# Patient Record
Sex: Female | Born: 1940 | Race: White | Hispanic: No | Marital: Single | Smoking: Never smoker
Health system: Southern US, Community
[De-identification: ages and names within clinical notes are randomized; demographics above are authoritative.]

---

## 2003-12-25 ENCOUNTER — Emergency Department (HOSPITAL_COMMUNITY): Admission: EM | Admit: 2003-12-25 | Discharge: 2003-12-25 | Payer: Self-pay | Admitting: Emergency Medicine

## 2005-12-12 IMAGING — CR DG ABDOMEN ACUTE W/ 1V CHEST
3 series · 3 of 3 positions shown · non-contrast
Comparison: none

CLINICAL DATA: Lower abdominal pain.
 ACUTE ABDOMEN SERIES, 12/25/03
 No prior studies.
 There is linear opacity in the right mid to lower lung consistent with subsegmental atelectasis or scarring.  The lungs appear otherwise clear.  Heart and mediastinum appear unremarkable.
 There are no signs of free intraperitoneal gas.  Calcific density in the left pelvis likely represents a uterine fibroid.  There is gas in the colon with scattered air-fluid levels raising the possibility of a diarrheal process.  No dilated bowel.
 IMPRESSION 
 No dilated bowel to suggest obstruction.
 Air-fluid levels scattered in the colon raising the possibility of diarrheal process.
 Probable small uterine leiomyoma.
 Scarring versus subsegmental atelectasis in the right mid lung.

[view not recorded (1 of 3)]
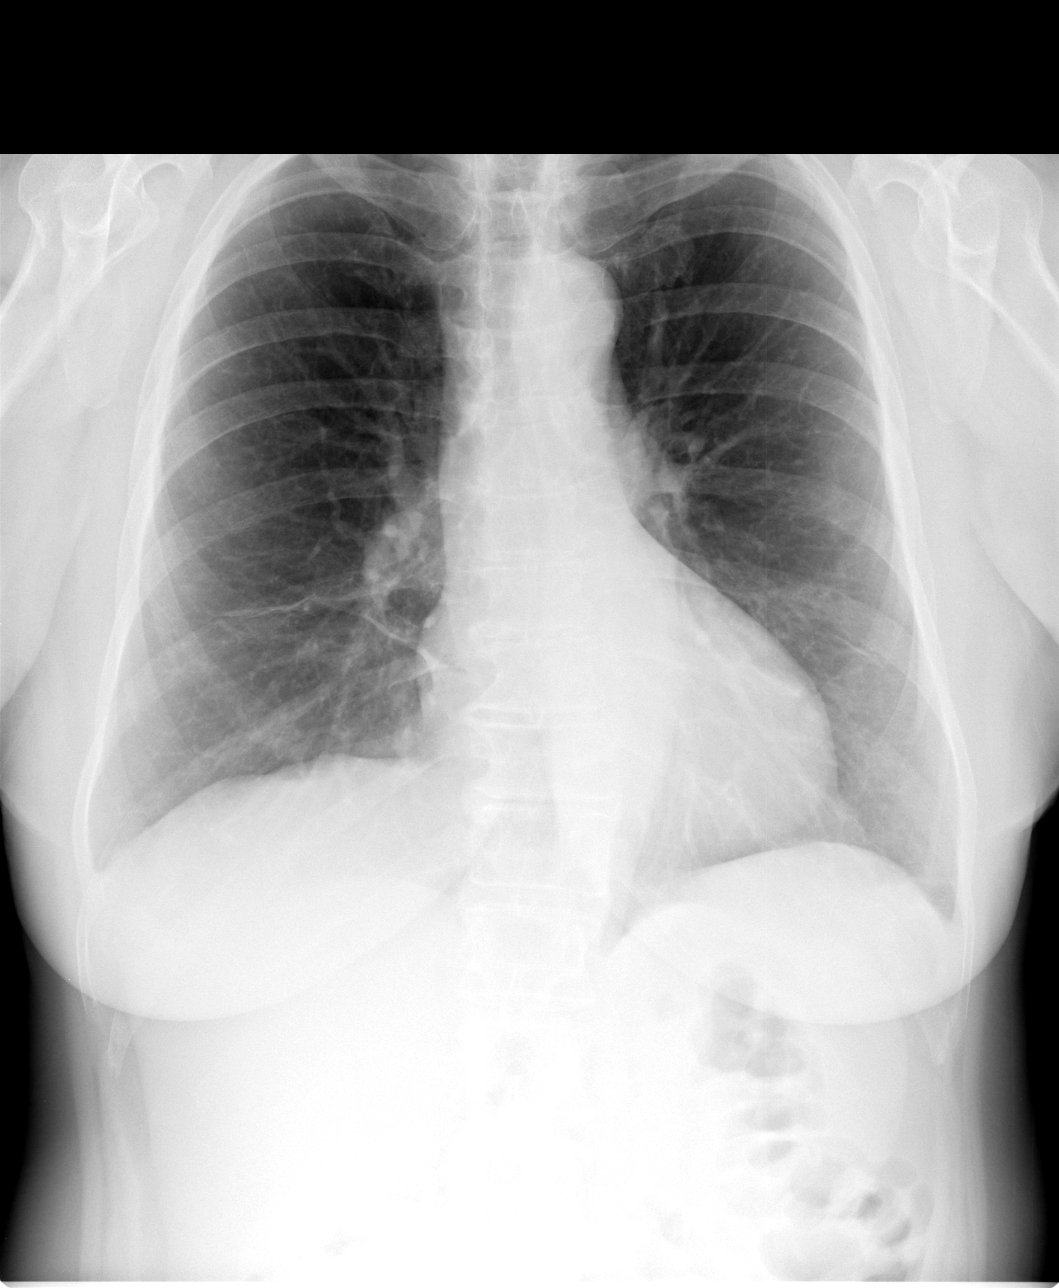

[view not recorded (2 of 3)]
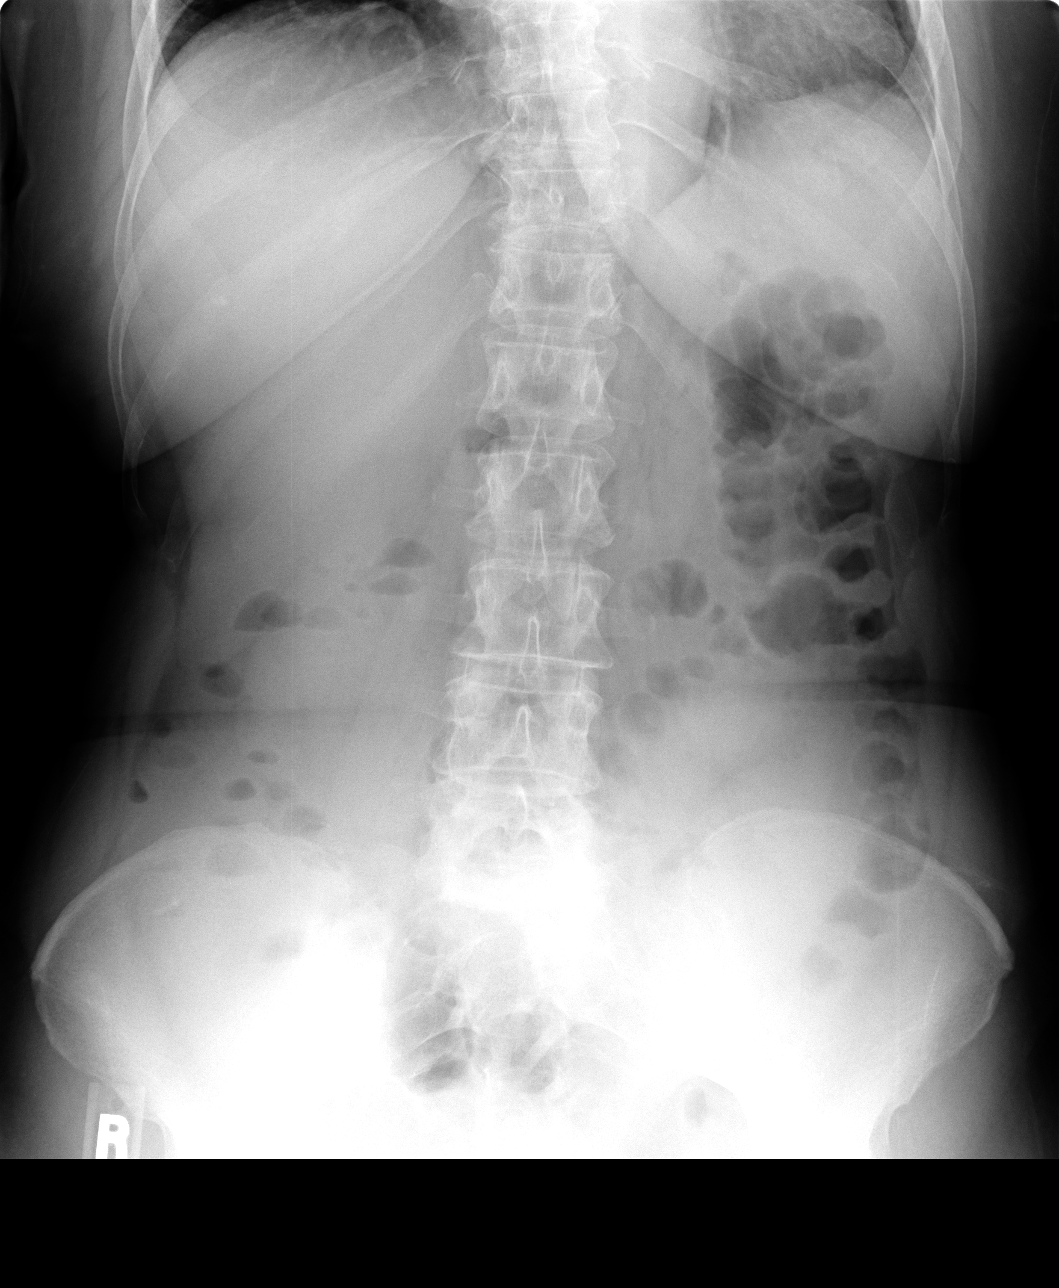

[view not recorded (3 of 3)]
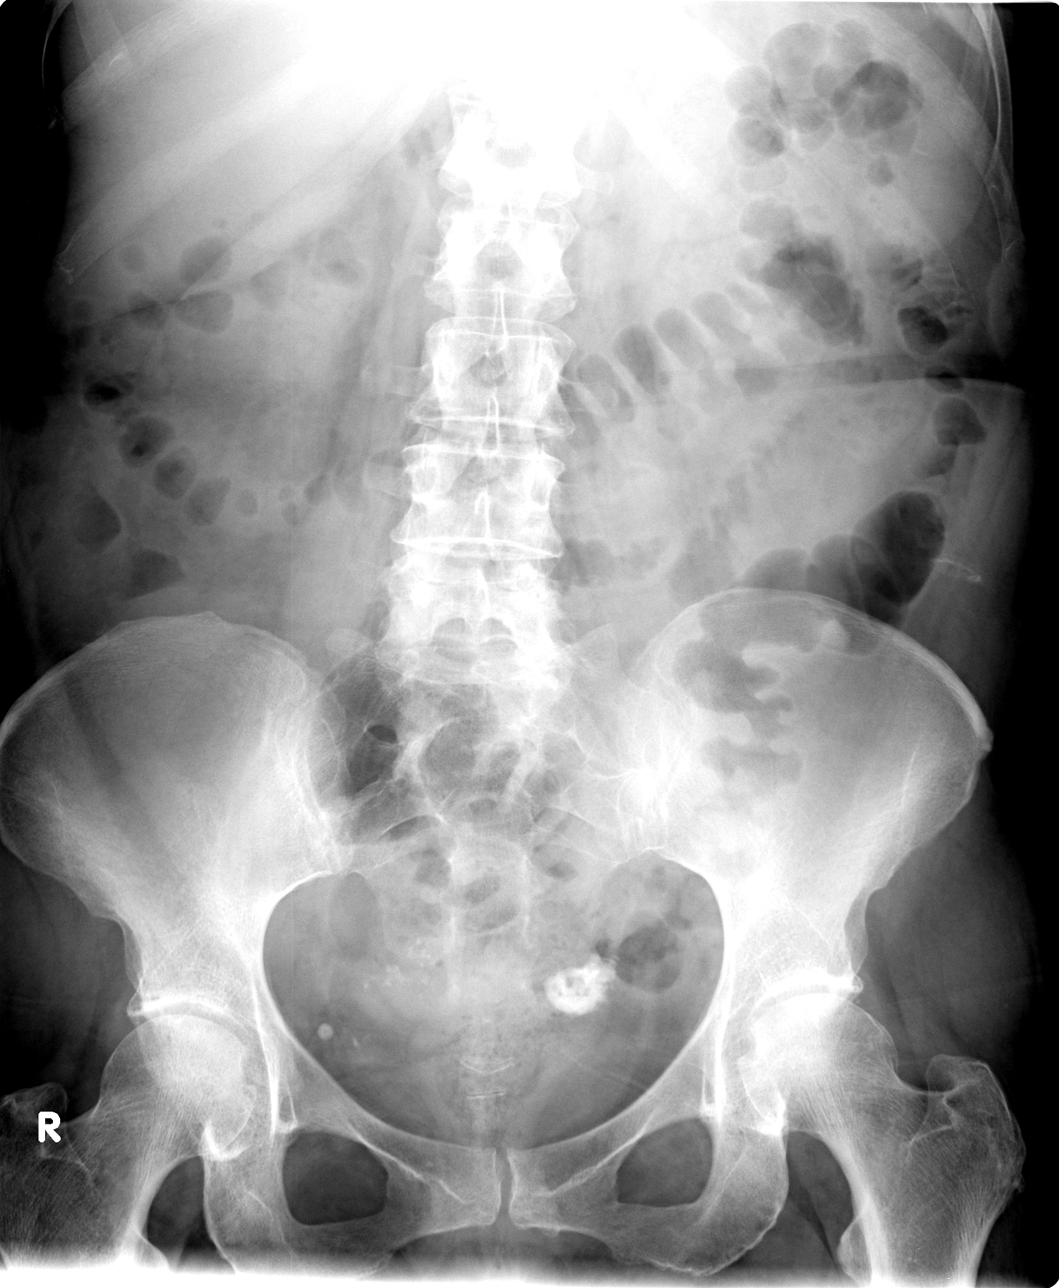

[3 of 3 positions shown; findings below may reference images not displayed]

## 2006-10-26 ENCOUNTER — Ambulatory Visit: Payer: Self-pay | Admitting: Family Medicine
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
## 2013-11-06 ENCOUNTER — Encounter (HOSPITAL_BASED_OUTPATIENT_CLINIC_OR_DEPARTMENT_OTHER): Payer: Self-pay | Admitting: Emergency Medicine

## 2013-11-06 ENCOUNTER — Emergency Department (HOSPITAL_BASED_OUTPATIENT_CLINIC_OR_DEPARTMENT_OTHER)
Admission: EM | Admit: 2013-11-06 | Discharge: 2013-11-06 | Discharge status: Against Medical Advice | Payer: Medicare Other | Attending: Emergency Medicine | Admitting: Emergency Medicine

## 2013-11-06 DIAGNOSIS — H5789 Other specified disorders of eye and adnexa: Secondary | ICD-10-CM | POA: Insufficient documentation

## 2013-11-06 DIAGNOSIS — L539 Erythematous condition, unspecified: Secondary | ICD-10-CM | POA: Insufficient documentation

## 2013-11-06 DIAGNOSIS — T4995XA Adverse effect of unspecified topical agent, initial encounter: Secondary | ICD-10-CM | POA: Insufficient documentation

## 2013-11-06 DIAGNOSIS — J302 Other seasonal allergic rhinitis: Secondary | ICD-10-CM

## 2013-11-06 DIAGNOSIS — Z79899 Other long term (current) drug therapy: Secondary | ICD-10-CM | POA: Insufficient documentation

## 2013-11-06 DIAGNOSIS — I10 Essential (primary) hypertension: Secondary | ICD-10-CM

## 2013-11-06 DIAGNOSIS — L299 Pruritus, unspecified: Secondary | ICD-10-CM | POA: Insufficient documentation

## 2013-11-06 MED ORDER — HYDROCHLOROTHIAZIDE 25 MG PO TABS: 25.0000 mg | ORAL_TABLET | Freq: Every day | ORAL | Status: AC

## 2013-11-06 MED ORDER — HYDRALAZINE HCL 20 MG/ML IJ SOLN: 20.0000 mg | Freq: Once | INTRAMUSCULAR | Status: DC

## 2013-11-06 MED ORDER — DIPHENHYDRAMINE HCL 25 MG PO CAPS: 25.0000 mg | ORAL_CAPSULE | Freq: Once | ORAL | Status: DC

## 2013-11-06 MED ORDER — LORATADINE 10 MG PO TABS: 10.0000 mg | ORAL_TABLET | Freq: Once | ORAL | Status: DC

## 2013-11-06 NOTE — ED Notes (Signed)
MD at bedside. 

## 2013-11-06 NOTE — ED Notes (Signed)
MD spent an extensive time speaking with patient about what test she would like to do.  Explained the reasonings such as organ damage due to patients BP being 237/129 for an extended period of time.  Pt refused all medical test.  Pt was not open to suggestions.  Pt continued to say "I dont need the medical world" "my stress is because I live alone".  Pt refused all testing and treatment reporting she couldn't afford it.  MD and I informed patient of social workers and case management who she could speak to about that and patient still refused.  MD having patient sign AMA.

## 2013-11-06 NOTE — ED Notes (Signed)
Attempted to go start IV and draw blood and do EKG.  PT reports she can't afford it.  MD notified.  MD going in to speak with patient.

## 2013-11-06 NOTE — ED Notes (Signed)
Pt c/o bil eye redness and itching x 1 month

## 2013-11-06 NOTE — ED Provider Notes (Signed)
CSN: [id number]     Arrival date & time 11/06/13  2202 History   This chart was scribed for Shanna Cisco, MD by Quintella Reichert, ED scribe.  This patient was seen in room MH05/MH05 and the patient's care was started at 8:38 PM.   Chief Complaint  Patient presents with  . Eye Problem     (Consider location/radiation/quality/duration/timing/severity/associated sxs/prior Treatment) Patient is a 73 y.o. female presenting with eye problem. The history is provided by the patient. No language interpreter was used.  Eye Problem Location:  Both Quality: itching and redness to both eyelids. Onset quality:  Gradual Duration:  2 months Progression:  Worsening Chronicity:  New Ineffective treatments: washing face. Associated symptoms: no discharge, no headaches, no nausea, no numbness, no photophobia, no vomiting and no weakness     HPI Comments:  Desiree Moody is a 73 y.o. female who presents to the Emergency Department complaining of redness and itching around eyelids that began 1-2 months ago.Symptoms started at left eye and spread to right eye. Also complains of itching in the back of neck. She reports attempting to treat symptoms by washing the area frequently. Pt denies fever, chills, vomiting, diarrhea, and swelling in month. She denies any new cosmetic and hygiene products, including skin products. She has not been seen for current complaint and does not have a PCP. Pt denies any chronic medical conditions, or other physical complaints.   History reviewed. No pertinent past medical history. History reviewed. No pertinent past surgical history. History reviewed. No pertinent family history. History  Substance Use Topics  . Smoking status: Never Smoker   . Smokeless tobacco: Not on file  . Alcohol Use: No   OB History   Grav Para Term Preterm Abortions TAB SAB Ect Mult Living                   Review of Systems  Constitutional: Negative for fever, chills, diaphoresis,  activity change, appetite change and fatigue.  HENT: Negative for congestion, facial swelling, rhinorrhea and sore throat.   Eyes: Negative for photophobia and discharge.  Respiratory: Negative for cough, chest tightness and shortness of breath.   Cardiovascular: Negative for chest pain, palpitations and leg swelling.  Gastrointestinal: Negative for nausea, vomiting, abdominal pain and diarrhea.  Endocrine: Negative for polydipsia and polyuria.  Genitourinary: Negative for dysuria, frequency, difficulty urinating and pelvic pain.  Musculoskeletal: Negative for arthralgias, back pain, neck pain and neck stiffness.  Skin: Positive for rash. Negative for color change and wound.       Redness and itching around eyes. Itching in back of neck.  Allergic/Immunologic: Negative for immunocompromised state.  Neurological: Negative for facial asymmetry, weakness, numbness and headaches.  Hematological: Does not bruise/bleed easily.  Psychiatric/Behavioral: Negative for confusion and agitation.      Allergies  Review of patient's allergies indicates no known allergies.  Home Medications   Prior to Admission medications   Medication Sig Start Date End Date Taking? Authorizing Provider  hydrochlorothiazide (HYDRODIURIL) 25 MG tablet Take 1 tablet (25 mg total) by mouth daily. 11/06/13   Shanna Cisco, MD   BP 210/129  Pulse 100  Temp(Src) 98.2 F (36.8 C) (Oral)  Resp 20  Ht 5\' 3"  (1.6 m)  Wt 160 lb (72.576 kg)  BMI 28.35 kg/m2  SpO2 100%  Physical Exam  Nursing note and vitals reviewed. Constitutional: She is oriented to person, place, and time. She appears well-developed and well-nourished. No distress.  HENT:  Head: Normocephalic.  Mouth/Throat: Oropharynx is clear and moist.  Eyes: Conjunctivae and EOM are normal. Pupils are equal, round, and reactive to light. Right eye exhibits no discharge and no exudate. Left eye exhibits no discharge and no exudate. Right conjunctiva is not  injected. Left conjunctiva is not injected.  Neck: Neck supple.  Cardiovascular: Normal rate, regular rhythm and normal heart sounds.   Pulmonary/Chest: Effort normal and breath sounds normal. No respiratory distress. She has no wheezes.  Abdominal: Soft. She exhibits no distension. There is no tenderness. There is no rebound and no guarding.  Musculoskeletal: She exhibits no edema and no tenderness.  Neurological: She is alert and oriented to person, place, and time.  Skin: Skin is warm and dry.  Erythema and flaking to upper and lower eyelids. Minimal edema.  Psychiatric: She has a normal mood and affect.    ED Course  Procedures (including critical care time)  DIAGNOSTIC STUDIES: Oxygen Saturation is 100% on room air, normal by my interpretation.    COORDINATION OF CARE: 8:45 PM- Informed pt that rash is likely due to allergies. Discussed treatment plan which includes antihistamines.  Also informed pt that blood pressure is high and treatment plan includes EKG, labs, and antihypertensives. Pt agreed to plan.     Labs Review Labs Reviewed - No data to display  Imaging Review No results found.   EKG Interpretation None      MDM   Final diagnoses:  Severe hypertension  Seasonal allergic reaction    Pt is a 73 y.o. female with Pmhx as above who presents with itching,symmetrically red, flaky skin around eyes. NO fevers.  Suspect seasonal allergies cause of initial itching, now with local skin irritation due to frequent washing & rubbing. Have rec OTC allergy meds such as benadryl, claritin or zyrtec as well as less frequent washing and use of lotion for sensitive skin. She was found to have an incidentally elevated BP which on repeat readings was up to 237 systolic. I discussed w/ her the seriousness of HTN and my wish to get a screening EKG and BMP and treatment of her BE in dept. She has refused, saying she isn't "on a plan" and can't afford it.  I discussed with her that she  is of medicare age, but she seemed confused by this and remained unwilling to accept treatment. She is also seems unwilling to accept my medical advice to establish with a local PCP for follow up. She will sign out AMA. Before leaving, we discussed risks of leaving and having untreated severe HTN including stroke, MI, pulmonary edema, ARF, loss of independence, death. She acknowledges this risks and would still like to leave untreated.        I personally performed the services described in this documentation, which was scribed in my presence. The recorded information has been reviewed and is accurate.    Shanna CiscoMegan E Docherty, MD 11/07/13 [phone]

## 2013-11-06 NOTE — Discharge Instructions (Signed)
Hypertension As your heart beats, it forces blood through your arteries. This force is your blood pressure. If the pressure is too high, it is called hypertension (HTN) or high blood pressure. HTN is dangerous because you may have it and not know it. High blood pressure may mean that your heart has to work harder to pump blood. Your arteries may be narrow or stiff. The extra work puts you at risk for heart disease, stroke, and other problems.  Blood pressure consists of two numbers, a higher number over a lower, 110/72, for example. It is stated as "110 over 72." The ideal is below 120 for the top number (systolic) and under 80 for the bottom (diastolic). Write down your blood pressure today. You should pay close attention to your blood pressure if you have certain conditions such as:  Heart failure.  Prior heart attack.  Diabetes  Chronic kidney disease.  Prior stroke.  Multiple risk factors for heart disease. To see if you have HTN, your blood pressure should be measured while you are seated with your arm held at the level of the heart. It should be measured at least twice. A one-time elevated blood pressure reading (especially in the Emergency Department) does not mean that you need treatment. There may be conditions in which the blood pressure is different between your right and left arms. It is important to see your caregiver soon for a recheck. Most people have essential hypertension which means that there is not a specific cause. This type of high blood pressure may be lowered by changing lifestyle factors such as:  Stress.  Smoking.  Lack of exercise.  Excessive weight.  Drug/tobacco/alcohol use.  Eating less salt. Most people do not have symptoms from high blood pressure until it has caused damage to the body. Effective treatment can often prevent, delay or reduce that damage. TREATMENT  When a cause has been identified, treatment for high blood pressure is directed at the  cause. There are a large number of medications to treat HTN. These fall into several categories, and your caregiver will help you select the medicines that are best for you. Medications may have side effects. You should review side effects with your caregiver. If your blood pressure stays high after you have made lifestyle changes or started on medicines,   Your medication(s) may need to be changed.  Other problems may need to be addressed.  Be certain you understand your prescriptions, and know how and when to take your medicine.  Be sure to follow up with your caregiver within the time frame advised (usually within two weeks) to have your blood pressure rechecked and to review your medications.  If you are taking more than one medicine to lower your blood pressure, make sure you know how and at what times they should be taken. Taking two medicines at the same time can result in blood pressure that is too low. SEEK IMMEDIATE MEDICAL CARE IF:  You develop a severe headache, blurred or changing vision, or confusion.  You have unusual weakness or numbness, or a faint feeling.  You have severe chest or abdominal pain, vomiting, or breathing problems. MAKE SURE YOU:   Understand these instructions.  Will watch your condition.  Will get help right away if you are not doing well or get worse. Document Released: 05/29/2005 Document Revised: 08/21/2011 Document Reviewed: 01/17/2008 [facility] Patient Information 2014 [place].   Allergies  Allergies may happen from anything your body is sensitive to. This may  be food, medicines, pollens, chemicals, and many other things. Food allergies can be severe and deadly.  HOME CARE  If you do not know what causes a reaction, keep a diary. Write down the foods you ate and the symptoms that followed. Avoid foods that cause reactions.  If you have red raised spots (hives) or a rash:  Take medicine as told by your doctor.  Use medicines for  red raised spots and itching as needed.  Apply cold cloths (compresses) to the skin. Take a cool bath. Avoid hot baths or showers.  If you are severely allergic:  It is often necessary to go to the hospital after you have treated your reaction.  Wear your medical alert jewelry.  You and your family must learn how to give a allergy shot or use an allergy kit (anaphylaxis kit).  Always carry your allergy kit or shot with you. Use this medicine as told by your doctor if a severe reaction is occurring. GET HELP RIGHT AWAY IF:  You have trouble breathing or are making high-pitched whistling sounds (wheezing).  You have a tight feeling in your chest or throat.  You have a puffy (swollen) mouth.  You have red raised spots, puffiness (swelling), or itching all over your body.  You have had a severe reaction that was helped by your allergy kit or shot. The reaction can return once the medicine has worn off.  You think you are having a food allergy. Symptoms most often happen within 30 minutes of eating a food.  Your symptoms have not gone away within 2 days or are getting worse.  You have new symptoms.  You want to retest yourself with a food or drink you think causes an allergic reaction. Only do this under the care of a doctor. MAKE SURE YOU:   Understand these instructions.  Will watch your condition.  Will get help right away if you are not doing well or get worse. Document Released: 09/23/2012 Document Reviewed: 09/23/2012 [facility] Patient Information 2014 [place], [place].   Emergency Department Resource Guide 1) Find a Doctor and Pay Out of Pocket Although you won't have to find out who is covered by your insurance plan, it is a good idea to ask around and get recommendations. You will then need to call the office and see if the doctor you have chosen will accept you as a new patient and what types of options they offer for patients who are self-pay. Some doctors offer  discounts or will set up payment plans for their patients who do not have insurance, but you will need to ask so you aren't surprised when you get to your appointment.  2) Contact Your Local Health Department Not all health departments have doctors that can see patients for sick visits, but many do, so it is worth a call to see if yours does. If you don't know where your local health department is, you can check in your phone book. The CDC also has a tool to help you locate your state's health department, and many state websites also have listings of all of their local health departments.  3) Find a [facility] If your illness is not likely to be very severe or complicated, you may want to try a walk in clinic. These are popping up all over the country in pharmacies, drugstores, and shopping centers. They're usually staffed by nurse practitioners or physician assistants that have been trained to treat common illnesses and complaints. They're usually fairly  quick and inexpensive. However, if you have serious medical issues or chronic medical problems, these are probably not your best option.  No Primary Care Doctor: - Call Health Connect at  [phone] - they can help you locate a primary care doctor that  accepts your insurance, provides certain services, etc. - Physician Referral Service- [phone]  Chronic Pain Problems: Organization         Address  Phone   Notes  [facility]  [phone] Patients need to be referred by their primary care doctor.   Medication Assistance: Organization         Address  Phone   Notes  [facility] Medication [facility] [address] [place], [place] [address] [phone] --Must be a resident of [facility] -- Must have NO insurance coverage whatsoever (no Medicaid/ Medicare, etc.) -- The pt. MUST have a primary care doctor that directs their care regularly and follows them in the community   MedAssist   [phone]   [employer]  [phone]    Agencies that provide inexpensive medical care: Organization         Address  Phone   Notes  [place]  [phone]   [facility]    [phone]   [facility] [place], [place] [address] [phone]   [place] [address] [place] [phone]   Planned Parenthood    [phone]   [facility]    [phone]   [place] and [place], [place] Phone:  [phone], Fax:  [phone] Hours of Operation:  9 am - 6 pm, M-F.  Also accepts Medicaid/Medicare and self-pay.  [facility] for [place], [address], [place] Phone: [phone], Fax: [phone]. Hours of Operation:  8:30 am - 5:30 pm, M-F.  Also accepts Medicaid and self-pay.  [facility] [place] [address] [place] Phone: [phone]   [place], [place], [place] [phone], Ext. [phone] Mondays & Thursdays: 7-9 AM.  First 15 patients are seen on a first come, first serve basis.    [place] Providers:  Organization         Address  Phone   Notes  [facility] [address], [place] [phone] Also accepts self-pay patients.  [facility] [address], [place]  [phone]   [place], [address], [place] [phone]   [facility] [address] [place] [phone]   Lucianne Lei [address], [place]   [phone] Only accepts [place] Access [place] patients after they have their name applied to their card.   Self-Pay (no insurance) in [facility]:  Organization         Address  Phone   Notes  Sickle Cell Patients, [facility] Internal Medicine [place] [phone]   [facility] [place] [phone]   [facility] [place]  [place], [address], [place] [phone]   [facility]/Dr. Osei-Bonsu  [address] [place] or [place], [address], [place] [phone] Phone number  for both [employer] and [place] locations is the same.  Urgent Medical and [facility] [address] [place] [phone]   [facility] [address], [place] or [address] [phone]   [facility] [address] [place] [phone], phone; [phone], fax Sees patients 1st and 3rd Saturday of every month.  Must not qualify for public or private insurance (i.e. Medicaid, Medicare, [place] Health Choice, Veterans' Benefits)  Household income should be no more than 200% of the poverty level The clinic cannot treat you if you are pregnant or think you are pregnant  Sexually transmitted diseases are not treated at the clinic.    Dental Care: Organization         Address  Phone  Notes  [facility] Department of [facility] [place] [phone] Accepts children up to age 42 who are enrolled in [place] or [place]; pregnant women with a Medicaid card; and children who have applied for Medicaid or [place] Health Choice, but were declined, whose parents can pay a reduced fee at time of service.  [facility] Department of [facility]  [address], [place] [phone] Accepts children up to age 6 who are enrolled in [place] or [place]; pregnant women with a Medicaid card; and children who have applied for Medicaid or [place] Health Choice, but were declined, whose parents can pay a reduced fee at time of service.  [place] Adult [facility] Access PROGRAM  [place]  [phone] Patients are seen by appointment only. Walk-ins are not accepted. [place] will see patients 69 years of age and older. Monday - Tuesday (8am-5pm) Most Wednesdays (8:30-5pm) $30 per visit, cash only  [facility] Adult [facility] Access PRO[facility]  [address], [facility] [phone] Patients are seen by appointment only. Walk-ins are not accepted. [place] will see patients 44 years of age and older. One Wednesday Evening (Monthly: Volunteer Based).  $30 per visit, cash only  [place]  [phone] for adults; Children under age 94, call Graduate Pediatric Dentistry at [phone]. Children aged 64-14, please call [phone] to request a pediatric application.  Dental services are provided in all areas of dental care including fillings, crowns and bridges, complete and partial dentures, implants, gum treatment, root canals, and extractions. Preventive care is also provided. Treatment is provided to both adults and children. Patients are selected via a lottery and there is often a waiting list.   [facility] [address] [place]  [phone] [email or web address]   [facility] [address] [place], [place] [phone], Ext. [phone] Second and Fourth Thursday of each month, opens at 6:30 AM; Clinic ends at 9 AM.  Patients are seen on a first-come first-served basis, and a limited number are seen during each clinic.   [facility]  [address] Hillard Danker [place], [place] [phone]   Eligibility Requirements You must have lived in [place], [place], or [place] counties for at least the last three months.   You cannot be eligible for state or federal sponsored [employer], including [employer], [place], or [employer].   You generally cannot be eligible for healthcare insurance through your employer.    How to apply: Eligibility screenings are held every Tuesday and Wednesday  afternoon from 1:00 pm until  4:00 pm. You do not need an appointment for the interview!  [facility] [address] [place], [place]   [place]  [place] Department  [place]  [phone]    Behavioral Health Resources in the Community: Intensive Outpatient Programs Organization         Address  Phone  Notes  [place]. [address], [place], [place] [phone]   [facility] Outpatient [address] [place], [place]   ADS: Alcohol & Drug Svcs [address] [place], [place]   [place] [address],  [place], [place] or [phone]   Substance Abuse Resources Organization         Address  Phone  Notes  Alcohol and Drug Services  [phone]   [place]  [phone]   [facility] [place]   Chinita Pester  [phone]   Residential & Outpatient Substance Abuse Program  [phone]   Psychological Services Organization         Address  Phone  Notes  [facility] [place]  [place]  [phone]   [place] [address], [place] or [phone]    Mobile Crisis Teams Organization         Address  Phone  Notes  [facility], Mobile Crisis Care Unit  [phone]   [facility]  [address] [place], [place]   [place] [address], [place] [phone]    Self-Help/Support Groups Organization         Address  Phone             Notes  [place]. of [place] - variety of support groups  [place] Call for more information  Narcotics Anonymous (NA), [facility] [address], [employer] [place]  2 meetings at this location   Materials engineer         Address  Phone  Notes  [facility] Residential Treatment [place],    [place]  [phone]   [facility]  [address], [place] [address]751, [place], [place]   [place], [place] [phone] Admissions: 8am-3pm M-F  Incentives Substance [place] [address],    [place], [place] [phone]   [facility] [address] [place], [place], [place]   [facility] [address]  [place], [place]   [facility] - Intensive Outpatient [address] Kristeen [address], [place], [place]   [facility] ([place] [address].,  [place], [place] [phone] or [phone]   [facility] Treatment Services (RTS) [address] [place], [place] Accepts Medicaid  [facility] [address]  [place] [phone] Substance Abuse/Addiction Treatment   [facility] Organization         Address  Phone  Notes  [facility]  [phone]   Domenic Schwab, PhD [address] Arlis Porta [place], [place]   [phone] or [phone]   [place], [place] [phone]   [place] [address], [place], [place] [phone] Insurance/Medicaid/sponsorship through [employer] and Families [address]  [place], [place] [phone] [place], [place] [phone]    Dr. Adele Schilder  [phone]   [facility] Dept. 1) [address] [place] 2) [place] 3)  [address], [place] [phone]  [phone]   [place] [phone] or [phone] (After Hours)

## 2015-09-11 DEATH — deceased
